# Patient Record
Sex: Female | Born: 1985 | Race: Black or African American | Hispanic: No | Marital: Single | Smoking: Current every day smoker
Health system: Southern US, Community
[De-identification: ages and names within clinical notes are randomized; demographics above are authoritative.]

## PROBLEM LIST (undated history)

## (undated) DIAGNOSIS — F419 Anxiety disorder, unspecified: Secondary | ICD-10-CM

---

## 2014-08-12 HISTORY — PX: TUBAL LIGATION: SHX77

## 2015-03-13 ENCOUNTER — Encounter (HOSPITAL_COMMUNITY): Payer: Self-pay | Admitting: Emergency Medicine

## 2015-03-13 ENCOUNTER — Emergency Department (HOSPITAL_COMMUNITY)
Admission: EM | Admit: 2015-03-13 | Discharge: 2015-03-13 | Disposition: A | Discharge status: Home / Self Care | Payer: Self-pay | Attending: Emergency Medicine | Admitting: Emergency Medicine

## 2015-03-13 DIAGNOSIS — K0889 Other specified disorders of teeth and supporting structures: Secondary | ICD-10-CM

## 2015-03-13 DIAGNOSIS — K088 Other specified disorders of teeth and supporting structures: Secondary | ICD-10-CM | POA: Insufficient documentation

## 2015-03-13 DIAGNOSIS — Z720 Tobacco use: Secondary | ICD-10-CM | POA: Insufficient documentation

## 2015-03-13 DIAGNOSIS — Z880 Allergy status to penicillin: Secondary | ICD-10-CM | POA: Insufficient documentation

## 2015-03-13 MED ORDER — CLINDAMYCIN HCL 150 MG PO CAPS: 150.0000 mg | ORAL_CAPSULE | Freq: Four times a day (QID) | ORAL | Status: DC

## 2015-03-13 MED ORDER — HYDROCODONE-ACETAMINOPHEN 5-325 MG PO TABS: 1.0000 | ORAL_TABLET | Freq: Four times a day (QID) | ORAL | Status: DC | PRN

## 2015-03-13 NOTE — ED Provider Notes (Signed)
CSN: [id number]     Arrival date & time 03/13/15  0102 History   First MD Initiated Contact with Patient 03/13/15 [phone]     Chief Complaint  Patient presents with  . Abscess   HPI   29 year old female presents today with dental pain. Patient reports starting yesterday she experienced right lower dental pain with swelling. She reports a history of the same previously with multiple dental caries. Patient reports she recently moved to the area, and has not made contact with the dentist. She reports pain to palpation, swelling. She denies difficulty swallowing, fever, chills, neck stiffness. Patient reports using ibuprofen and Tylenol with some mild relief of symptoms.    History reviewed. No pertinent past medical history. Past Surgical History  Procedure Laterality Date  . Tubal ligation  2016  . Cesarean section  2016   No family history on file. History  Substance Use Topics  . Smoking status: Current Every Day Smoker -- 0.50 packs/day  . Smokeless tobacco: Not on file  . Alcohol Use: No   OB History    No data available     Review of Systems  All other systems reviewed and are negative.   Allergies  Penicillins  Home Medications   Prior to Admission medications   Medication Sig Start Date End Date Taking? Authorizing Provider  clindamycin (CLEOCIN) 150 MG capsule Take 1 capsule (150 mg total) by mouth every 6 (six) hours. 03/13/15   Eyvonne Mechanic, PA-C  HYDROcodone-acetaminophen (NORCO/VICODIN) 5-325 MG per tablet Take 1 tablet by mouth every 6 (six) hours as needed. 03/13/15   Eyvonne Mechanic, PA-C   BP 111/76 mmHg  Pulse 62  Temp(Src) 98.9 F (37.2 C) (Oral)  Resp 18  Ht  (1.549 m)  Wt 124 lb (56.246 kg)  BMI 23.44 kg/m2  SpO2 100%  LMP 03/02/2015 (Exact Date)    Physical Exam  Constitutional: She is oriented to person, place, and time. She appears well-developed and well-nourished.  HENT:  Head: Normocephalic and atraumatic.  Face asymmetrical with right  sided lower mandibular swelling, no warmth, redness to the cheeks. Tender to palpation. Patient has full range of motion of her jaw. Tongue normal full range of motion, floor mouth soft nontender, posterior oropharynx with no signs of edema or infection.  Obvious swelling to the buccal side of the right second lower molar. No obvious or palpable abscess. Remainder of jaw normal nontender. Patient has numerous other caries throughout.  Eyes: Conjunctivae are normal. Pupils are equal, round, and reactive to light. Right eye exhibits no discharge. Left eye exhibits no discharge. No scleral icterus.  Neck: Normal range of motion. Neck supple. No JVD present. No tracheal deviation present.  Pulmonary/Chest: Effort normal. No stridor.  Neurological: She is alert and oriented to person, place, and time. Coordination normal.  Psychiatric: She has a normal mood and affect. Her behavior is normal. Judgment and thought content normal.  Nursing note and vitals reviewed.   ED Course  Procedures (including critical care time)  NERVE BLOCK Performed by: Thermon Leyland Consent: Verbal consent obtained. Required items: required blood products, implants, devices, and special equipment available Time out: Immediately prior to procedure a "time out" was called to verify the correct patient, procedure, equipment, support staff and site/side marked as required.  Indication: Dental point  Nerve block body site: Inferior alveolar right   Preparation: Patient was prepped and draped in the usual sterile fashion. Needle gauge: 24 G Location technique: anatomical landmarks  Local anesthetic:  Marcaine 0.5% with epinephrine   Anesthetic total: 1.8 ml  Outcome: pain improved Patient tolerance: Patient tolerated the procedure well with no immediate complications.  Risks and benefits procedure   Labs Review Labs Reviewed - No data to display  Imaging Review No results found.   EKG  Interpretation None      MDM   Final diagnoses:  Pain, dental    Labs:  Imaging:  Consults:  Therapeutics: Bupivacaine  Discharge Meds: Clindamycin, Vicodin  Assessment/Plan: Patient presents with dental pain with surrounding soft tissue swelling. This is a localized infection, no palpable abscess that I could I&D at this time. Patient is afebrile, vital signs reassuring, pain significantly improved with dental block. Patient has full active range of motion of neck no concern for deep space infection at this time. Patient be discharged home with oral antibiotics, pain medication, resources for follow-up with local tenderness. He should verbalize understanding and agreement today's plan, understood strict return precautions.         Eyvonne Mechanic, PA-C 03/14/15 1610  Mirian Mo, MD 03/16/15 [phone]

## 2015-03-13 NOTE — ED Notes (Signed)
PA Hedges at bedside  

## 2015-03-13 NOTE — Discharge Instructions (Signed)
Dental Pain °A tooth ache may be caused by cavities (tooth decay). Cavities expose the nerve of the tooth to air and hot or cold temperatures. It may come from an infection or abscess (also called a boil or furuncle) around your tooth. It is also often caused by dental caries (tooth decay). This causes the pain you are having. °DIAGNOSIS  °Your caregiver can diagnose this problem by exam. °TREATMENT  °· If caused by an infection, it may be treated with medications which kill germs (antibiotics) and pain medications as prescribed by your caregiver. Take medications as directed. °· Only take over-the-counter or prescription medicines for pain, discomfort, or fever as directed by your caregiver. °· Whether the tooth ache today is caused by infection or dental disease, you should see your dentist as soon as possible for further care. °SEEK MEDICAL CARE IF: °The exam and treatment you received today has been provided on an emergency basis only. This is not a substitute for complete medical or dental care. If your problem worsens or new problems (symptoms) appear, and you are unable to meet with your dentist, call or return to this location. °SEEK IMMEDIATE MEDICAL CARE IF:  °· You have a fever. °· You develop redness and swelling of your face, jaw, or neck. °· You are unable to open your mouth. °· You have severe pain uncontrolled by pain medicine. °MAKE SURE YOU:  °· Understand these instructions. °· Will watch your condition. °· Will get help right away if you are not doing well or get worse. °Document Released: 07/29/2005 Document Revised: 10/21/2011 Document Reviewed: 03/16/2008 °ExitCare® Patient Information ©2015 [employer]. This information is not intended to replace advice given to you by your health care provider. Make sure you discuss any questions you have with your health care provider. ° °Emergency Department Resource Guide °1) Find a Doctor and Pay Out of Pocket °Although you won't have to find out who  is covered by your insurance plan, it is a good idea to ask around and get recommendations. You will then need to call the office and see if the doctor you have chosen will accept you as a new patient and what types of options they offer for patients who are self-pay. Some doctors offer discounts or will set up payment plans for their patients who do not have insurance, but you will need to ask so you aren't surprised when you get to your appointment. ° °2) Contact Your Local Health Department °Not all health departments have doctors that can see patients for sick visits, but many do, so it is worth a call to see if yours does. If you don't know where your local health department is, you can check in your phone book. The CDC also has a tool to help you locate your state's health department, and many state websites also have listings of all of their local health departments. ° °3) Find a Walk-in Clinic °If your illness is not likely to be very severe or complicated, you may want to try a walk in clinic. These are popping up all over the country in pharmacies, drugstores, and shopping centers. They're usually staffed by nurse practitioners or physician assistants that have been trained to treat common illnesses and complaints. They're usually fairly quick and inexpensive. However, if you have serious medical issues or chronic medical problems, these are probably not your best option. ° °No Primary Care Doctor: °- Call Health Connect at  832-8000 - they can help you locate a primary   care doctor that  accepts your insurance, provides certain services, etc. °- Physician Referral Service- 1-800-533-3463 ° °Chronic Pain Problems: °Organization         Address  Phone   Notes  °[facility]  ([phone]) [phone] Patients need to be referred by their primary care doctor.  ° °Medication Assistance: °Organization         Address  Phone   Notes  °[place] [facility] [address]  [address] °[place], [place] [address] (336) 641-8030 --Must be a resident of [place] °-- Must have NO insurance coverage whatsoever (no Medicaid/ Medicare, etc.) °-- The pt. MUST have a primary care doctor that directs their care regularly and follows them in the community °  °[facility]  (866) 331-1348   °[employer]  (888) 892-1162   ° °Agencies that provide inexpensive medical care: °Organization         Address  Phone   Notes  °[facility]  (336) 832-8035   °[facility]    (336) 832-7272   °[facility] [address] °[place], [place] [address] (336) 832-4777   °[facility] [address], °[place] (336) 271-4999   °[facility]    (336) 373-0678   °[facility]    (336) 272-1050   °[facility] ° [address], [place] Phone:  (336) 832-4444, Fax:  (336) 832-4440 Hours of Operation:  9 am - 6 pm, M-F.  Also accepts Medicaid/Medicare and self-pay.  °[facility] ° [address], [place] Phone: (336) 832-3150, Fax: (336) [phone]. Hours of Operation:  8:30 am - 5:30 pm, M-F.  Also accepts Medicaid and self-pay.  °[facility] [place] [address], [place] Phone: (336) 878-6027   °[facility] [address], [place], [place] (336)723-1848, Ext. 123 Mondays & Thursdays: 7-9 AM.  First 15 patients are seen on a first come, first serve basis. °  ° °Medicaid-accepting [place] Providers: ° °Organization         Address  Phone   Notes  °[facility] [address], [place] (336) 641-2100 Also accepts self-pay patients.  °[facility] [address], [place] ° (336) 856-9996   °[facility] [address], [place] (336) 288-8857   °[facility] [address] [place] [address], [place] (336) 299-7000   °[facility] [address], [place]  ° (336) 373-1557 Only accepts [place] Access Medicaid patients after they have their name applied to their card.  ° °Self-Pay (no insurance) in [place]: ° °Organization         Address  Phone   Notes  °Sickle Cell Patients, [facility] [address], [place] (336) 832-1970   °[facility] [address], [place] (336) 832-4400   ° [facility] ° [address], [place] (336) 992-4800   °[facility]/Dr. Osei-Bonsu ° [address] [place] [address], [place] or [address], [place] (336) [phone] Phone number for both [place] and [place] locations is the same.  °[facility] [address], [place] (336) 299-0000   °[facility] [address] [place] [address], [place] or [address] (336) 852-7530 °(336) 878-2260   °  [facility] [address], [place] (336) 350-1642, phone; (336) 294-5005, fax Sees patients 1st and 3rd Saturday of every month.  Must not qualify for public or private insurance (i.e. Medicaid, Medicare, [place] Health Choice, Veterans' Benefits) • Household income should be no more than 200% of the poverty level •The clinic cannot treat you if you are pregnant or think you are pregnant • Sexually transmitted diseases are not treated at the clinic.  ° ° °Dental Care: °Organization         Address  Phone  Notes  °[place] [facility] [address], [place] (336) 641-6152 Accepts children up to age 21 who are enrolled in Medicaid or [place] Health Choice; pregnant women with a Medicaid card; and children who have applied for Medicaid or [place] Health Choice, but were declined, whose parents can pay a reduced fee at time of service.  °[facility] [place]  [address], [place] (336) 641-7733 Accepts children up to age 21 who are  enrolled in Medicaid or [place] Health Choice; pregnant women with a Medicaid card; and children who have applied for Medicaid or [place] Health Choice, but were declined, whose parents can pay a reduced fee at time of service.  °[facility] ° [address], [place] (336) 641-4533 Patients are seen by appointment only. Walk-ins are not accepted. [facility] will see patients 18 years of age and older. °Monday - Tuesday (8am-5pm) °Most Wednesdays (8:30-5pm) °$30 per visit, cash only  °[facility] ° [address], [place] (336) [phone] Patients are seen by appointment only. Walk-ins are not accepted. [facility] will see patients 18 years of age and older. °One Wednesday Evening (Monthly: Volunteer Based).  $30 per visit, cash only  °[facility]  (919) 537-3737 for adults; Children under age 4, call Graduate Pediatric Dentistry at ([phone]) [phone]. Children aged 4-14, please call ([phone]) [phone] to request a pediatric application. ° Dental services are provided in all areas of dental care including fillings, crowns and bridges, complete and partial dentures, implants, gum treatment, root canals, and extractions. Preventive care is also provided. Treatment is provided to both adults and children. °Patients are selected via a lottery and there is often a waiting list. °  °[facility] [address], ° ° (336) 763-8833 [email or web address] °  °[facility] [address], [place], [place] (336)723-1848, Ext. 123 Second and Fourth Thursday of each month, opens at 6:30 AM; Clinic ends at 9 AM.  Patients are seen on a first-come first-served basis, and a limited number are seen during each clinic.  ° °[facility] ° [address], [place], [place] (336) [phone]   Eligibility Requirements °You must have lived in [place], [place], or [place] counties for at least the last three months. °  You  cannot be eligible for state or federal sponsored healthcare insurance, including Veterans Administration, Medicaid, or Medicare. °  You generally cannot be eligible for healthcare insurance through your employer.  °  How to apply: °Eligibility screenings are held every Tuesday and Wednesday afternoon from 1:00 pm until 4:00 pm. You do not need an appointment for the interview!  °[facility] [address], [place],  [phone]   °[facility]  336-342-8273   °[facility]  336-703-3100   °[facility]   [place] in the Community: Intensive Outpatient Programs Organization         Address  Phone  Notes  [facility] [place] [address] [place], [place] (934) 758-5776   [facility] [address], [place], [place]   [facility] [address] [place], [place]   [place] [address]  [place], [place] or [phone]   Substance Abuse Resources Organization         Address  Phone  Notes  [facility]  724-828-4836   [place]  250-131-6260   [facility]   Chinita Pester  586 544 4668   Residential & Outpatient Substance Abuse Program  (801)402-8013   Psychological Services Organization         Address  Phone  Notes  [facility] [place]  [place]  (941)746-9411   [place] [address] [place] or 339-836-4699    Mobile Crisis Teams Organization         Address  Phone  Notes  [facility], Mobile Crisis Care Unit  626-147-2728   [facility]  [address] [place], [place]   [place] [address] [place] (412) 818-8695    Self-Help/Support  Groups Organization         Address  Phone             Notes  [facility] [place] - variety of support groups  [place] Call for more information  Narcotics Anonymous (NA), [facility] [address], [employer] [place]  2 meetings at this location   Special educational needs teacher         Address  Phone  Notes  [facility] [place],    [place]  1-628-731-6347   [facility]  [address] [place] [address], [place], [place]   [place], [place] (704)[phone] Admissions: 8am-3pm M-F  Incent[facility] Substance [place] [address]    [place], [place] [phone]   [facility] [address] [place], [place], [place]   [facility] [address]  [place], [place]   [facility] - Intensive Outpatient [address], [place], [place]   [facility] (House.) [place]  [place], [place] 1-956-035-2015 or (929)697-8638   [facility] ([facility]) [address], [place], [place] Accepts Medicaid  [facility] [address],  [place] 1-(209)636-8495 Substance Abuse/Addiction Treatment   [facility]         Address  Phone  Notes  [facility]  641-236-8678   Domenic Schwab, PhD [address] [place], [place]   (475)585-4080 or (412) [phone]   [place], [place] [phone]   [place] [address] [place], [place] [phone] Insurance/Medicaid/sponsorship through [employer] and Families [address], [place]                                    [place], [place] [phone] Therapy/tele-psych/case  [facility] [address]   [place], [place] (864)[phone]    Dr. Lolly Mustache  [phone]   [facility]  [facility] Dept. 1) [address] [place] 2) [address], [place] 3)  [address], [place] [phone]  (787)[phone]   [facility] Child Abuse Hotline (281)356-3143 or 6084830675 (After Hours)     Please use medication only as directed, please immediately follow-up with dentist for further evaluation and management. Please return immediately if new or worsening signs or symptoms present

## 2015-03-13 NOTE — ED Notes (Signed)
Patient reports lower right tooth pain x 2 days. Reports that she has had abscesses previously, and believes this to be as well. Rates pain 10/10 on 0-10 pain scale.

## 2015-03-29 ENCOUNTER — Emergency Department (HOSPITAL_COMMUNITY)
Admission: EM | Admit: 2015-03-29 | Discharge: 2015-03-29 | Disposition: A | Discharge status: Home / Self Care | Payer: Self-pay | Attending: Emergency Medicine | Admitting: Emergency Medicine

## 2015-03-29 ENCOUNTER — Encounter (HOSPITAL_COMMUNITY): Payer: Self-pay | Admitting: Emergency Medicine

## 2015-03-29 DIAGNOSIS — L0291 Cutaneous abscess, unspecified: Secondary | ICD-10-CM

## 2015-03-29 DIAGNOSIS — Z880 Allergy status to penicillin: Secondary | ICD-10-CM | POA: Insufficient documentation

## 2015-03-29 DIAGNOSIS — Z720 Tobacco use: Secondary | ICD-10-CM | POA: Insufficient documentation

## 2015-03-29 DIAGNOSIS — M65052 Abscess of tendon sheath, left thigh: Secondary | ICD-10-CM | POA: Insufficient documentation

## 2015-03-29 HISTORY — DX: Anxiety disorder, unspecified: F41.9

## 2015-03-29 MED ORDER — SULFAMETHOXAZOLE-TRIMETHOPRIM 800-160 MG PO TABS: 1.0000 | ORAL_TABLET | Freq: Two times a day (BID) | ORAL | Status: AC

## 2015-03-29 MED ORDER — LIDOCAINE-EPINEPHRINE (PF) 2 %-1:200000 IJ SOLN
10.0000 mL | Freq: Once | INTRAMUSCULAR | Status: AC
Start: 2015-03-29 — End: 2015-03-29
  Administered 2015-03-29: 10 mL
  Filled 2015-03-29: qty 20

## 2015-03-29 NOTE — ED Provider Notes (Signed)
History  This chart was scribed for non-physician practitioner, Mayme Genta, PA-C,working with Lavera Guise, MD, by Karle Plumber, ED Scribe. This patient was seen in room TR05C/TR05C and the patient's care was started at 10:00 PM.  Chief Complaint  Patient presents with  . Abscess   The history is provided by the patient and medical records. No language interpreter was used.    HPI Comments:  Alexis Ferrell is a 29 y.o. female who presents to the Emergency Department complaining of an abscess to the left inner thigh that appeared approximately one week ago. She reports moderate pain of the area and rates it as 6-7/10. She has not taken anything for pain. She states she has been taking hot showers with no resolution of the abscess. Touching the area makes the pain worse. She denies alleviating factors. She denies fever, chills, nausea, vomiting, bowel or bladder changes. PMHx of HTN.  Past Medical History  Diagnosis Date  . Anxiety    Past Surgical History  Procedure Laterality Date  . Tubal ligation  2016  . Cesarean section  2016   No family history on file. Social History  Substance Use Topics  . Smoking status: Current Every Day Smoker -- 0.50 packs/day  . Smokeless tobacco: None  . Alcohol Use: No   OB History    No data available     Review of Systems  Constitutional: Negative for fever and chills.  Gastrointestinal: Negative for nausea and vomiting.  Genitourinary:       No bowel or bladder changes  Skin: Positive for wound.  All other systems reviewed and are negative.   Allergies  Penicillins  Home Medications   Prior to Admission medications   Medication Sig Start Date End Date Taking? Authorizing Provider  clindamycin (CLEOCIN) 150 MG capsule Take 1 capsule (150 mg total) by mouth every 6 (six) hours. 03/13/15   Eyvonne Mechanic, PA-C  HYDROcodone-acetaminophen (NORCO/VICODIN) 5-325 MG per tablet Take 1 tablet by mouth every 6 (six) hours as needed.  03/13/15   Eyvonne Mechanic, PA-C  sulfamethoxazole-trimethoprim (BACTRIM DS,SEPTRA DS) 800-160 MG per tablet Take 1 tablet by mouth 2 (two) times daily. 03/29/15 04/05/15  Joycie Peek, PA-C   Triage Vitals: BP 143/106 mmHg  Pulse 95  Temp(Src) 98.5 F (36.9 C) (Oral)  Resp 20  SpO2 98%  LMP 03/02/2015 (Exact Date) Physical Exam  Constitutional: She is oriented to person, place, and time. She appears well-developed and well-nourished.  HENT:  Head: Normocephalic and atraumatic.  Eyes: EOM are normal.  Neck: Normal range of motion.  Cardiovascular: Normal rate.   Pulmonary/Chest: Effort normal.  Musculoskeletal: Normal range of motion.  Neurological: She is alert and oriented to person, place, and time.  Skin: Skin is warm and dry. There is erythema.  2 raised erythematous lesions to medial aspect of left proximal thigh consistent with abscess. No active drainage.  Psychiatric: She has a normal mood and affect. Her behavior is normal.  Nursing note and vitals reviewed.   ED Course  Procedures (including critical care time) DIAGNOSTIC STUDIES: Oxygen Saturation is 98% on RA, normal by my interpretation.   COORDINATION OF CARE: 10:07 PM- Bedside ultrasound performed. Will incise and drain abscesses. Pt verbalizes understanding and agrees to plan.  EMERGENCY DEPARTMENT US SOFT TISSUE INTERPRETATION "Study: Limited Ultrasound of the noted body part in comments below"  INDICATIONS: Soft tissue infection Multiple views of the body part are obtained with a multi-frequency linear probe  PERFORMED BY:  Myself  IMAGES ARCHIVED?: Yes  SIDE:Left  BODY PART:Lower extremity  FINDINGS: Abcess present  LIMITATIONS:  Body Habitus  INTERPRETATION:  Cellulitis present  COMMENT:  Abscess present with small surrounding cellulitis    INCISION AND DRAINAGE PROCEDURE NOTE: Patient identification was confirmed and verbal consent was obtained. This procedure was performed by Mayme Genta, PA-C at 10:09 PM. Site: left inner thigh Sterile procedures observed Needle size: 25 G Anesthetic used (type and amt): Lidocaine 2% with Epinephrine (3 mLs) Blade size: 11 Drainage: scant Complexity: Complex Packing used: none Site anesthetized, incision made over site, wound drained and explored loculations, rinsed with copious amounts of normal saline, covered with dry, sterile dressing. Pt tolerated procedure well without complications. Instructions for care discussed verbally and pt provided with additional written instructions for homecare and f/u.   Medications  lidocaine-EPINEPHrine (XYLOCAINE W/EPI) 2 %-1:200000 (PF) injection 10 mL (10 mLs Infiltration Given 03/29/15 2214)    Labs Review Labs Reviewed - No data to display  Imaging Review No results found. I have personally reviewed and evaluated these images and lab results as part of my medical decision-making.   EKG Interpretation None     Meds given in ED:  Medications  lidocaine-EPINEPHrine (XYLOCAINE W/EPI) 2 %-1:200000 (PF) injection 10 mL (10 mLs Infiltration Given 03/29/15 2214)    Discharge Medication List as of 03/29/2015 10:52 PM    START taking these medications   Details  sulfamethoxazole-trimethoprim (BACTRIM DS,SEPTRA DS) 800-160 MG per tablet Take 1 tablet by mouth 2 (two) times daily., Starting 03/29/2015, Until Wed 04/05/15, Print       Filed Vitals:   03/29/15 2138 03/29/15 2257  BP: 143/106 115/80  Pulse: 95 72  Temp: 98.5 F (36.9 C)   TempSrc: Oral   Resp: 20 18  SpO2: 98% 100%    MDM  Vitals stable - WNL -afebrile Pt resting comfortably in ED. PE--small abscess noted to left medial proximal thigh. Scant drainage with IND. Due to mild surrounding cellulitis and location of abscess, Will DC with antibiotics. Also given referral to general surgery for definitive care follow-up as this is a recurring issue.  I discussed all relevant lab findings and imaging results with pt  and they verbalized understanding. Discussed f/u with PCP within 48 hrs and return precautions, pt very amenable to plan.  Final diagnoses:  Abscess      I personally performed the services described in this documentation, which was scribed in my presence. The recorded information has been reviewed and is accurate.     Joycie Peek, PA-C 03/30/15 0147  Lavera Guise, MD 03/30/15 [phone]

## 2015-03-29 NOTE — ED Notes (Signed)
Pt left at this time with all belongings.  

## 2015-03-29 NOTE — ED Notes (Signed)
C/o abscess to L inner thigh x 1 week.  History of same.

## 2015-03-29 NOTE — Discharge Instructions (Signed)
Please follow-up with Gen. surgery for further evaluation and management of your abscess. Please take her antibiotics as prescribed. Return to ED for worsening symptoms.  Abscess Care After An abscess (also called a boil or furuncle) is an infected area that contains a collection of pus. Signs and symptoms of an abscess include pain, tenderness, redness, or hardness, or you may feel a moveable soft area under your skin. An abscess can occur anywhere in the body. The infection may spread to surrounding tissues causing cellulitis. A cut (incision) by the surgeon was made over your abscess and the pus was drained out. Gauze may have been packed into the space to provide a drain that will allow the cavity to heal from the inside outwards. The boil may be painful for 5 to 7 days. Most people with a boil do not have high fevers. Your abscess, if seen early, may not have localized, and may not have been lanced. If not, another appointment may be required for this if it does not get better on its own or with medications. HOME CARE INSTRUCTIONS   Only take over-the-counter or prescription medicines for pain, discomfort, or fever as directed by your caregiver.  When you bathe, soak and then remove gauze or iodoform packs at least daily or as directed by your caregiver. You may then wash the wound gently with mild soapy water. Repack with gauze or do as your caregiver directs. SEEK IMMEDIATE MEDICAL CARE IF:   You develop increased pain, swelling, redness, drainage, or bleeding in the wound site.  You develop signs of generalized infection including muscle aches, chills, fever, or a general ill feeling.  An oral temperature above 102 F (38.9 C) develops, not controlled by medication. See your caregiver for a recheck if you develop any of the symptoms described above. If medications (antibiotics) were prescribed, take them as directed. Document Released: 02/14/2005 Document Revised: 10/21/2011 Document  Reviewed: 10/12/2007 [facility] Patient Information 2015 [place], [place]. This information is not intended to replace advice given to you by your health care provider. Make sure you discuss any questions you have with your health care provider.

## 2020-07-28 ENCOUNTER — Encounter: Payer: Self-pay | Admitting: Emergency Medicine

## 2020-07-28 ENCOUNTER — Emergency Department
Admission: EM | Admit: 2020-07-28 | Discharge: 2020-07-28 | Disposition: A | Discharge status: Home / Self Care | Payer: Medicaid - Out of State | Attending: Emergency Medicine | Admitting: Emergency Medicine

## 2020-07-28 ENCOUNTER — Other Ambulatory Visit: Payer: Self-pay

## 2020-07-28 DIAGNOSIS — F17200 Nicotine dependence, unspecified, uncomplicated: Secondary | ICD-10-CM | POA: Insufficient documentation

## 2020-07-28 DIAGNOSIS — L0291 Cutaneous abscess, unspecified: Secondary | ICD-10-CM

## 2020-07-28 DIAGNOSIS — L02413 Cutaneous abscess of right upper limb: Secondary | ICD-10-CM | POA: Diagnosis not present

## 2020-07-28 MED ORDER — SULFAMETHOXAZOLE-TRIMETHOPRIM 800-160 MG PO TABS: 1.0000 | ORAL_TABLET | Freq: Two times a day (BID) | ORAL | 0 refills | Status: DC

## 2020-07-28 NOTE — ED Notes (Signed)
Dry sterile dressing applied to right groin abscess.

## 2020-07-28 NOTE — Discharge Instructions (Signed)
Follow-up with Dr. Everlene Farrier please call for an appointment.  Continue to apply warm compress.  Take the antibiotic as prescribed.  Keep a bandage over your leg.  Return emergency department for worsening

## 2020-07-28 NOTE — ED Provider Notes (Signed)
[facility] Emergency Department Provider Note  ____________________________________________   Event Date/Time   First MD Initiated Contact with Patient 07/28/20 1216     (approximate)  I have reviewed the triage vital signs and the nursing notes.   HISTORY  Chief Complaint Abscess    HPI Alexis Ferrell is a 34 y.o. female presents emergency department complaining of abscess to the right upper leg which is recurrent.  Patient states the area burst earlier today.  She states all the pus came out.  States it hurts when she walks as her legs rub together.  She wants me to take the core out so will happen again.  She denies fever chills   Past Medical History:  Diagnosis Date  . Anxiety     There are no problems to display for this patient.   Past Surgical History:  Procedure Laterality Date  . CESAREAN SECTION  2016  . TUBAL LIGATION  2016    Prior to Admission medications   Medication Sig Start Date End Date Taking? Authorizing Provider  sulfamethoxazole-trimethoprim (BACTRIM DS) 800-160 MG tablet Take 1 tablet by mouth 2 (two) times daily. 07/28/20   Faythe Ghee, PA-C    Allergies Penicillins  History reviewed. No pertinent family history.  Social History Social History   Tobacco Use  . Smoking status: Current Every Day Smoker    Packs/day: 0.50  Substance Use Topics  . Alcohol use: No  . Drug use: No    Review of Systems  Constitutional: No fever/chills Eyes: No visual changes. ENT: No sore throat. Respiratory: Denies cough Genitourinary: Negative for dysuria. Musculoskeletal: Negative for back pain. Skin: Negative for rash. Psychiatric: no mood changes,     ____________________________________________   PHYSICAL EXAM:  VITAL SIGNS: ED Triage Vitals [07/28/20 1125]  Enc Vitals Group     BP 116/86     Pulse Rate 72     Resp 18     Temp 97.9 F (36.6 C)     Temp Source Oral     SpO2 99 %     Weight 135  lb (61.2 kg)     Height 5\' 1"  (1.549 m)     Head Circumference      Peak Flow      Pain Score 8     Pain Loc      Pain Edu?      Excl. in GC?     Constitutional: Alert and oriented. Well appearing and in no acute distress. Eyes: Conjunctivae are normal.  Head: Atraumatic. Nose: No congestion/rhinnorhea. Mouth/Throat: Mucous membranes are moist.   Neck:  supple no lymphadenopathy noted Cardiovascular: Normal rate, regular rhythm.  Respiratory: Normal respiratory effort.  No retractions,  GU: deferred Musculoskeletal: FROM all extremities, warm and well perfused Neurologic:  Normal speech and language.  Skin:  Skin is warm, dry, positive for a 1 cm sized open area on the right upper leg, some induration noted around the outside but no fluctuance, area appears to have already drained  psychiatric: Mood and affect are normal. Speech and behavior are normal.  ____________________________________________   LABS (all labs ordered are listed, but only abnormal results are displayed)  Labs Reviewed - No data to display ____________________________________________   ____________________________________________  RADIOLOGY    ____________________________________________   PROCEDURES  Procedure(s) performed: No  Procedures    ____________________________________________   INITIAL IMPRESSION / ASSESSMENT AND PLAN / ED COURSE  Pertinent labs & imaging results that were available during my  care of the patient were reviewed by me and considered in my medical decision making (see chart for details).   Patient is 34 year old female presents with an abscess to the right upper leg.  See HPI.  Physical exam shows a already opened and draining abscess.  I did explain findings to the patient.  Explained her it is nothing we can do from the ED other than place her on antibiotics.  She is to follow-up with surgery.  Patient is not happy with my answer but unfortunately this is  standard of care for this type of abscess.  She was given a prescription for Septra and a work note.  She is to follow-up with surgery and a phone number was provided.  She is discharged stable condition.     Alexis Ferrell was evaluated in Emergency Department on 07/28/2020 for the symptoms described in the history of present illness. She was evaluated in the context of the global COVID-19 pandemic, which necessitated consideration that the patient might be at risk for infection with the SARS-CoV-2 virus that causes COVID-19. Institutional protocols and algorithms that pertain to the evaluation of patients at risk for COVID-19 are in a state of rapid change based on information released by regulatory bodies including the CDC and federal and state organizations. These policies and algorithms were followed during the patient's care in the ED.    As part of my medical decision making, I reviewed the following data within the electronic MEDICAL RECORD NUMBER Nursing notes reviewed and incorporated, Old chart reviewed, Notes from prior ED visits and [place] Controlled Substance Database  ____________________________________________   FINAL CLINICAL IMPRESSION(S) / ED DIAGNOSES  Final diagnoses:  Abscess      NEW MEDICATIONS STARTED DURING THIS VISIT:  New Prescriptions   SULFAMETHOXAZOLE-TRIMETHOPRIM (BACTRIM DS) 800-160 MG TABLET    Take 1 tablet by mouth 2 (two) times daily.     Note:  This document was prepared using Dragon voice recognition software and may include unintentional dictation errors.    Faythe Ghee, PA-C 07/28/20 1228    Jene Every, MD 07/28/20 [phone]

## 2020-07-28 NOTE — ED Triage Notes (Signed)
Pt to ED via POV with abscess that has opened earlier today, pt reports earlier today was bleeding. No bleeding noted at this time.

## 2020-08-02 ENCOUNTER — Encounter: Payer: Self-pay | Admitting: Surgery

## 2020-08-02 ENCOUNTER — Ambulatory Visit (INDEPENDENT_AMBULATORY_CARE_PROVIDER_SITE_OTHER): Payer: PRIVATE HEALTH INSURANCE | Admitting: Surgery

## 2020-08-02 ENCOUNTER — Other Ambulatory Visit: Payer: Self-pay

## 2020-08-02 VITALS — BP 118/80 | HR 86 | Temp 97.7°F | Ht 61.0 in | Wt 143.8 lb

## 2020-08-02 DIAGNOSIS — L732 Hidradenitis suppurativa: Secondary | ICD-10-CM | POA: Diagnosis not present

## 2020-08-02 MED ORDER — SULFAMETHOXAZOLE-TRIMETHOPRIM 800-160 MG PO TABS: 1.0000 | ORAL_TABLET | Freq: Two times a day (BID) | ORAL | 0 refills | Status: AC

## 2020-08-02 NOTE — Patient Instructions (Signed)
Our surgery scheduler Britta Mccreedy will be calling you within 24-48 to get you scheduled for surgery. If you have not heard anything by Wednesday 12/29, please give our office a call. Please have your blue sheet ready to write down important information when she calls.  Take the antibiotic Batrim for the next 10 days. Pick up from pharmacy.

## 2020-08-04 NOTE — Progress Notes (Signed)
Patient ID: Alexis Ferrell, female   DOB: March 27, 1986, 34 y.o.   MRN: [id number]  HPI Alexis Ferrell is a 34 y.o. female seen in consultation at the request of Ms. Fisher PA-C for potential right thigh abscess.  She went to emergency room about a week ago complaining of right thigh pain.  She still have intermittent pain that is in the right thigh that is sharp moderate intensity worsening when she walks.  No fevers no chills.  She does have a history of hidradenitis in the past.  The lesion is very close to the right inguinal area.  He was placed on Bactrim with significant improvement in symptoms but now she has some pain.  HPI  Past Medical History:  Diagnosis Date  . Anxiety     Past Surgical History:  Procedure Laterality Date  . CESAREAN SECTION  2016  . TUBAL LIGATION  2016    History reviewed. No pertinent family history.  Social History Social History   Tobacco Use  . Smoking status: Current Every Day Smoker    Packs/day: 0.50  . Smokeless tobacco: Current User  Substance Use Topics  . Alcohol use: No  . Drug use: No    Allergies  Allergen Reactions  . Penicillins Swelling    Current Outpatient Medications  Medication Sig Dispense Refill  . sulfamethoxazole-trimethoprim (BACTRIM DS) 800-160 MG tablet Take 1 tablet by mouth 2 (two) times daily. 20 tablet 0   No current facility-administered medications for this visit.     Review of Systems Full ROS  was asked and was negative except for the information on the HPI  Physical Exam Blood pressure 118/80, pulse 86, temperature 97.7 F (36.5 C), temperature source Oral, height 5\' 1"  (1.549 m), weight 143 lb 12.8 oz (65.2 kg), SpO2 98 %. CONSTITUTIONAL: NAD EYES: Pupils are equal, round,  Sclera are non-icteric. EARS, NOSE, MOUTH AND THROAT: She is wearing a mask. Hearing is intact to voice. LYMPH NODES:  Lymph nodes in the neck are normal. RESPIRATORY:  Lungs are clear. There is normal respiratory effort, with  equal breath sounds bilaterally, and without pathologic use of accessory muscles. CARDIOVASCULAR: Heart is regular without murmurs, gallops, or rubs. GI: The abdomen is soft, nontender, and nondistended. There are no palpable masses. There is no hepatosplenomegaly. There are normal bowel sounds in all quadrants. GU: Rectal deferred.   MUSCULOSKELETAL: Normal muscle strength and tone. No cyanosis or edema.   SKIN: There is evidence of a 2 cm fluctuant lesion on the right thigh close to the inguinal area.  There is no evidence of necrotizing infection.  NEUROLOGIC: Motor and sensation is grossly normal. Cranial nerves are grossly intact. PSYCH:  Oriented to person, place and time. Affect is normal.  Data Reviewed  I have personally reviewed the patient's imaging, laboratory findings and medical records.    Assessment/Plan Right thigh hidradenitis with potential abscess.  Discussed with patient in detail about her disease process option of potential I&D here in the office versus in the operating room were explained to the patient.  Patient does have significant sensitivities to the right thigh.   She chooses to proceed with I&D and excision of hidradenitis in the OR.  In the interim we will start antibiotic therapy.  She understands that she may need more urgent I&D.  Discussed with patient detail.  Risks, benefits and possible complications including but not limited to: Bleeding, infection, chronic pain.  She understands and wished to proceed Copy of this report was  sent to the referring provider Sterling Big, MD FACS General Surgeon 08/04/2020, 5:05 PM

## 2020-08-09 ENCOUNTER — Telehealth: Payer: Self-pay | Admitting: Surgery

## 2020-08-09 NOTE — Telephone Encounter (Signed)
After getting surgery scheduled, patient then calls back to cancel her surgery for 08/20/20.  Patient has come into the area just recently, not sure if her to stay.  But she has [place] Medicaid.  She is informed that we do not accept out of state medicaid, she would be a self pay.  After she called the number for patient accounts, they had informed her the amount that she would owe.  Patient states does not have the money for surgery.  At her request all is cancelled.

## 2020-08-15 ENCOUNTER — Other Ambulatory Visit: Payer: PRIVATE HEALTH INSURANCE

## 2020-08-16 ENCOUNTER — Other Ambulatory Visit: Payer: PRIVATE HEALTH INSURANCE

## 2020-08-17 ENCOUNTER — Encounter: Admission: RE | Payer: Self-pay | Source: Home / Self Care

## 2020-08-17 ENCOUNTER — Ambulatory Visit: Admission: RE | Admit: 2020-08-17 | Payer: PRIVATE HEALTH INSURANCE | Source: Home / Self Care | Admitting: Surgery

## 2020-08-17 SURGERY — EXCISION, HIDRADENITIS, INGUINAL REGION
Anesthesia: Choice | Laterality: Right

## 2020-09-14 ENCOUNTER — Other Ambulatory Visit: Payer: Self-pay

## 2020-09-14 ENCOUNTER — Encounter: Payer: Self-pay | Admitting: Emergency Medicine

## 2020-09-14 ENCOUNTER — Emergency Department: Payer: Medicaid - Out of State

## 2020-09-14 ENCOUNTER — Emergency Department
Admission: EM | Admit: 2020-09-14 | Discharge: 2020-09-14 | Disposition: A | Discharge status: Home / Self Care | Payer: Medicaid - Out of State | Attending: Emergency Medicine | Admitting: Emergency Medicine

## 2020-09-14 DIAGNOSIS — F17200 Nicotine dependence, unspecified, uncomplicated: Secondary | ICD-10-CM | POA: Insufficient documentation

## 2020-09-14 DIAGNOSIS — M79674 Pain in right toe(s): Secondary | ICD-10-CM | POA: Insufficient documentation

## 2020-09-14 MED ORDER — IBUPROFEN 600 MG PO TABS
600.0000 mg | ORAL_TABLET | Freq: Once | ORAL | Status: AC
  Administered 2020-09-14: 600 mg via ORAL
  Filled 2020-09-14: qty 1

## 2020-09-14 NOTE — ED Provider Notes (Signed)
[facility] Emergency Department Provider Note  ____________________________________________   Event Date/Time   First MD Initiated Contact with Patient 09/14/20 [phone]     (approximate)  I have reviewed the triage vital signs and the nursing notes.   HISTORY  Chief Complaint Toe Pain    HPI Alexis Ferrell is a 35 y.o. female with no chronic medical problems except for history of asthma.  She presents by EMS from work for evaluation of right great toe pain.  She said the toe has been hurting for about a month and sometimes feels numb but mostly is an aching pain that has become severe.  It is worse when she is on her feet for long periods of time.  She has not had any specific trauma or injury.  She has to wear steel toed sneakers for work  that she feels are not fitted well for her.  She has not seen a foot specialist in the past.  She has no other pain or injuries.  Nothing in particular makes her feel better.        Past Medical History:  Diagnosis Date  . Anxiety     There are no problems to display for this patient.   Past Surgical History:  Procedure Laterality Date  . CESAREAN SECTION  2016  . TUBAL LIGATION  2016    Prior to Admission medications   Medication Sig Start Date End Date Taking? Authorizing Provider  sulfamethoxazole-trimethoprim (BACTRIM DS) 800-160 MG tablet Take 1 tablet by mouth 2 (two) times daily. 08/02/20   Pabon, Merri Ray, MD    Allergies Banana, Watermelon flavor, and Penicillins  History reviewed. No pertinent family history.  Social History Social History   Tobacco Use  . Smoking status: Current Every Day Smoker    Packs/day: 0.50  . Smokeless tobacco: Current User  Substance Use Topics  . Alcohol use: No  . Drug use: No    Review of Systems Constitutional: No fever/chills Respiratory: Denies shortness of breath. Gastrointestinal: No abdominal pain.  No nausea, no vomiting.   Musculoskeletal: Right  great toe pain, worse recently. Integumentary: Negative for rash.  No wound. Neurological: Some numbness in the right toe associated with the pain.   ____________________________________________   PHYSICAL EXAM:  VITAL SIGNS: ED Triage Vitals  Enc Vitals Group     BP 09/14/20 0338 (!) 130/99     Pulse Rate 09/14/20 0338 83     Resp 09/14/20 0338 14     Temp 09/14/20 0338 98.1 F (36.7 C)     Temp Source 09/14/20 0338 Oral     SpO2 09/14/20 0338 100 %     Weight 09/14/20 0339 59 kg (130 lb)     Height 09/14/20 0339 1.549 m (5\' 1" )     Head Circumference --      Peak Flow --      Pain Score 09/14/20 0338 9     Pain Loc --      Pain Edu? --      Excl. in GC? --     Constitutional: Alert and oriented.  Eyes: Conjunctivae are normal.  Head: Atraumatic. Cardiovascular: Normal rate, regular rhythm. Good peripheral circulation. Respiratory: Normal respiratory effort.  No retractions. Musculoskeletal: Normal-appearing right foot and particularly the right great toe.  No evidence of podagra, cellulitis, compartment syndrome, gross deformity that would suggest fracture or dislocation, wound, subungual hematoma.  The patient reports pain when I palpate the toe but it is soft  and the compartments are easily compressible.  There is no induration or erythema. Neurologic:  Normal speech and language. No gross focal neurologic deficits are appreciated.  Skin:  Skin is warm, dry and intact.  No evidence of lacerations, ecchymosis, contusions, nor diabetic wounds on the patient's right foot and toe. Psychiatric: Mood and affect are normal. Speech and behavior are normal.  ____________________________________________   LABS (all labs ordered are listed, but only abnormal results are displayed)  Labs Reviewed - No data to display ____________________________________________  EKG  No indication for emergent EKG ____________________________________________  RADIOLOGY I, Loleta Rose,  personally viewed and evaluated these images (plain radiographs) as part of my medical decision making, as well as reviewing the written report by the radiologist.  ED MD interpretation: No acute abnormalities  Official radiology report(s): DG Toe Great Right  Result Date: 09/14/2020 CLINICAL DATA:  First toe pain for 1 month, no known injury, initial encounter EXAM: RIGHT GREAT TOE COMPARISON:  None. FINDINGS: There is no evidence of fracture or dislocation. There is no evidence of arthropathy or other focal bone abnormality. Soft tissues are unremarkable. IMPRESSION: No acute abnormality noted. Electronically Signed   By: Alcide Clever M.D.   On: 09/14/2020 03:54    ____________________________________________   PROCEDURES   Procedure(s) performed (including Critical Care):  Procedures   ____________________________________________   INITIAL IMPRESSION / MDM / ASSESSMENT AND PLAN / ED COURSE  As part of my medical decision making, I reviewed the following data within the electronic MEDICAL RECORD NUMBER Nursing notes reviewed and incorporated, Old chart reviewed, Radiograph reviewed , Notes from prior ED visits and [place] Controlled Substance Database   Differential diagnosis includes, but is not limited to, musculoskeletal pain/strain, stress fracture, gout/podagra, dislocation, diabetic foot wound, cellulitis, neuropathy, plantar fasciitis.  Strongly suspect chronic pain due to her footwear and job responsibilities.  I ordered a radiograph to see if there is any evidence of bony abnormality that might suggest a healing stress fracture.  There is no evidence of infection, edema, gout, etc.  I explained it was unlikely I would identify a specific cause but I will encourage her to follow-up with podiatry and provide some follow-up information.  No indication for blood work.  Patient understands and agrees with the plan.       Clinical Course as of 09/14/20 0444  Thu Sep 14, 2020  0441 DG  Toe Great Right I personally reviewed the patient's imaging and agree with the radiologist's interpretation that there is no evidence of an acute abnormality.  I updated the patient with the reassuring results.  I will provide her with a hard soled shoe for comfort and strongly encourage close podiatry follow-up.  She understands and agrees with the plan. [CF]    Clinical Course User Index [CF] Loleta Rose, MD     ____________________________________________  FINAL CLINICAL IMPRESSION(S) / ED DIAGNOSES  Final diagnoses:  Great toe pain, right     MEDICATIONS GIVEN DURING THIS VISIT:  Medications  ibuprofen (ADVIL) tablet 600 mg (has no administration in time range)     ED Discharge Orders    None      *Please note:  Lakrista Scaduto was evaluated in Emergency Department on 09/14/2020 for the symptoms described in the history of present illness. She was evaluated in the context of the global COVID-19 pandemic, which necessitated consideration that the patient might be at risk for infection with the SARS-CoV-2 virus that causes COVID-19. Institutional protocols and algorithms that pertain to  the evaluation of patients at risk for COVID-19 are in a state of rapid change based on information released by regulatory bodies including the CDC and federal and state organizations. These policies and algorithms were followed during the patient's care in the ED.  Some ED evaluations and interventions may be delayed as a result of limited staffing during and after the pandemic.*  Note:  This document was prepared using Dragon voice recognition software and may include unintentional dictation errors.   Loleta Rose, MD 09/14/20 [phone]

## 2020-09-14 NOTE — Discharge Instructions (Addendum)
We did not identify a specific reason that your right big toe has been hurting for months and is worse recently.  It likely has to do with your shoes and prolonged periods of time was standing on your feet.  Try using the provided hard soled shoe which can provide some comfort . we recommend you take doses of ibuprofen and Tylenol, use ice packs on your toe when possible, elevate it when you are not having to be standing, and follow-up with the podiatrist at the next available opportunity.  He may be able to offer recommendations in terms of inserts or different kinds of shoes that would satisfy your job requirements but me more comfortable and not because your toe to hurt.

## 2020-09-14 NOTE — ED Triage Notes (Signed)
Pt to ED via EMS from work c/o right big toe pain x1 month.  Had trouble standing any longer at work and called EMS.  No known injury, no obvious swelling or deformity.  Skin WNL, + pedal  Pulses.

## 2021-04-12 ENCOUNTER — Emergency Department (HOSPITAL_COMMUNITY)
Admission: EM | Admit: 2021-04-12 | Discharge: 2021-04-12 | Disposition: A | Discharge status: Home / Self Care | Payer: Medicaid - Out of State | Attending: Emergency Medicine | Admitting: Emergency Medicine

## 2021-04-12 ENCOUNTER — Emergency Department (HOSPITAL_COMMUNITY): Payer: Medicaid - Out of State

## 2021-04-12 DIAGNOSIS — M25511 Pain in right shoulder: Secondary | ICD-10-CM | POA: Diagnosis not present

## 2021-04-12 DIAGNOSIS — M792 Neuralgia and neuritis, unspecified: Secondary | ICD-10-CM

## 2021-04-12 DIAGNOSIS — M79641 Pain in right hand: Secondary | ICD-10-CM | POA: Diagnosis present

## 2021-04-12 DIAGNOSIS — F17210 Nicotine dependence, cigarettes, uncomplicated: Secondary | ICD-10-CM | POA: Diagnosis not present

## 2021-04-12 DIAGNOSIS — R200 Anesthesia of skin: Secondary | ICD-10-CM | POA: Diagnosis not present

## 2021-04-12 DIAGNOSIS — M79601 Pain in right arm: Secondary | ICD-10-CM | POA: Insufficient documentation

## 2021-04-12 MED ORDER — METHOCARBAMOL 500 MG PO TABS: 500.0000 mg | ORAL_TABLET | Freq: Two times a day (BID) | ORAL | 0 refills | Status: AC

## 2021-04-12 MED ORDER — DICLOFENAC POTASSIUM 50 MG PO TABS: 50.0000 mg | ORAL_TABLET | Freq: Two times a day (BID) | ORAL | 0 refills | Status: AC

## 2021-04-12 MED ORDER — DICLOFENAC SODIUM 50 MG PO TBEC: 50.0000 mg | DELAYED_RELEASE_TABLET | Freq: Two times a day (BID) | ORAL | 0 refills | Status: AC

## 2021-04-12 NOTE — ED Triage Notes (Signed)
Pt here from home with c/o right wrist pain , pt works at [employer]  and using that wrist a lot , no trauma noted

## 2021-04-12 NOTE — ED Provider Notes (Signed)
[facility] EMERGENCY DEPARTMENT Provider Note   CSN: [id number] Arrival date & time: 04/12/21  0754     History No chief complaint on file.   Alexis Ferrell is a 35 y.o. female.  35 year old female presents with complaint of pain with numbness and tingling in her whole right hand which radiates up her arm to her shoulder.  Patient states this is per been progressively worsening, states that she works in [employer] and is right-hand dominant, pain is limiting her job.  Patient has tried taking ibuprofen without improvement.  Patient states that her mother had carpal tunnel and feels this is same.  She denies any falls or injuries or other trauma to the arm.      Past Medical History:  Diagnosis Date   Anxiety     There are no problems to display for this patient.   Past Surgical History:  Procedure Laterality Date   CESAREAN SECTION  2016   TUBAL LIGATION  2016     OB History   No obstetric history on file.     No family history on file.  Social History   Tobacco Use   Smoking status: Every Day    Packs/day: 0.50    Types: Cigarettes   Smokeless tobacco: Current  Substance Use Topics   Alcohol use: No   Drug use: No    Home Medications Prior to Admission medications   Medication Sig Start Date End Date Taking? Authorizing Provider  diclofenac (CATAFLAM) 50 MG tablet Take 1 tablet (50 mg total) by mouth 2 (two) times daily for 10 days. 04/12/21 04/22/21 Yes Jeannie Fend, PA-C  diclofenac (VOLTAREN) 50 MG EC tablet Take 1 tablet (50 mg total) by mouth 2 (two) times daily for 10 days. 04/12/21 04/22/21 Yes Jeannie Fend, PA-C  methocarbamol (ROBAXIN) 500 MG tablet Take 1 tablet (500 mg total) by mouth 2 (two) times daily. 04/12/21  Yes Jeannie Fend, PA-C  methocarbamol (ROBAXIN) 500 MG tablet Take 1 tablet (500 mg total) by mouth 2 (two) times daily. 04/12/21  Yes Jeannie Fend, PA-C  sulfamethoxazole-trimethoprim (BACTRIM DS) 800-160 MG  tablet Take 1 tablet by mouth 2 (two) times daily. 08/02/20   Pabon, Merri Ray, MD    Allergies    Banana, Watermelon flavor, and Penicillins  Review of Systems   Review of Systems  Constitutional:  Negative for fever.  Musculoskeletal:  Positive for myalgias. Negative for arthralgias.  Skin:  Negative for color change, rash and wound.  Allergic/Immunologic: Negative for immunocompromised state.  Neurological:  Positive for numbness. Negative for weakness.  Hematological:  Negative for adenopathy.  Psychiatric/Behavioral:  Negative for confusion.   All other systems reviewed and are negative.  Physical Exam Updated Vital Signs BP 137/90   Pulse (!) 105   Temp 98.3 F (36.8 C) (Oral)   Resp 14   LMP 03/22/2021 (Approximate)   SpO2 97%   Physical Exam Vitals and nursing note reviewed.  Constitutional:      General: She is not in acute distress.    Appearance: She is well-developed. She is not diaphoretic.  HENT:     Head: Normocephalic and atraumatic.  Cardiovascular:     Pulses: Normal pulses.  Pulmonary:     Effort: Pulmonary effort is normal.  Musculoskeletal:        General: Tenderness present. No swelling, deformity or signs of injury.     Comments: TTP right trapezius area. Normal ROM RUE, sensation intact  Skin:    General: Skin is warm and dry.     Capillary Refill: Capillary refill takes less than 2 seconds.     Findings: No bruising, erythema or rash.  Neurological:     Mental Status: She is alert and oriented to person, place, and time.     Sensory: No sensory deficit.     Motor: No weakness.  Psychiatric:        Behavior: Behavior normal.    ED Results / Procedures / Treatments   Labs (all labs ordered are listed, but only abnormal results are displayed) Labs Reviewed - No data to display  EKG None  Radiology DG Wrist Complete Right  Result Date: 04/12/2021 CLINICAL DATA:  Right wrist pain and numbness for 3 days EXAM: RIGHT WRIST - COMPLETE 3+  VIEW COMPARISON:  None. FINDINGS: There is no acute fracture or dislocation. Radiocarpal alignment is normal. The joint spaces are preserved. The soft tissues are unremarkable. IMPRESSION: Normal wrist radiographs Electronically Signed   By: Lesia Hausen M.D.   On: 04/12/2021 08:31    Procedures Procedures   Medications Ordered in ED Medications - No data to display  ED Course  I have reviewed the triage vital signs and the nursing notes.  Pertinent labs & imaging results that were available during my care of the patient were reviewed by me and considered in my medical decision making (see chart for details).  Clinical Course as of 04/12/21 0857  Thu Apr 12, 2021  2719 35 year old female with complaint of pain and numbness in her right arm as above.  Grip strength is equal, sensation intact, brisk capillary refill present to each finger.  She does have palpable spasm and tenderness in her right trapezius area.  X-ray of the right wrist is negative for acute injury.  Discussed with patient various causes of the radicular pain she is experiencing, generally treated with gentle stretching, anti-inflammatory and muscle relaxant.  Referred to orthopedics for follow-up although advised to contact her employer, if this is a work-related problem she may benefit from [employer]. evaluation. [LM]    Clinical Course User Index [LM] Alden Hipp   MDM Rules/Calculators/A&P                            Final Clinical Impression(s) / ED Diagnoses Final diagnoses:  Radicular pain in right arm    Rx / DC Orders ED Discharge Orders          Ordered    methocarbamol (ROBAXIN) 500 MG tablet  2 times daily        04/12/21 0844    diclofenac (VOLTAREN) 50 MG EC tablet  2 times daily        04/12/21 0844    methocarbamol (ROBAXIN) 500 MG tablet  2 times daily        04/12/21 0855    diclofenac (CATAFLAM) 50 MG tablet  2 times daily        04/12/21 0855             Jeannie Fend, PA-C 04/12/21 0539    Terald Sleeper, MD 04/12/21 1118

## 2021-04-12 NOTE — Discharge Instructions (Addendum)
Discuss your problem with your employer who may have you see their workers comp provider to evaluate if this is caused by your work duties.  Referral to orthopedics for further treatment and evaluation if your job will not send you to a workers comp provider.   Take diclofenac and Robaxin as prescribed.  Gentle stretching as discussed.

## 2021-06-20 ENCOUNTER — Encounter (HOSPITAL_COMMUNITY): Payer: Self-pay

## 2021-06-20 ENCOUNTER — Other Ambulatory Visit: Payer: Self-pay

## 2021-06-20 ENCOUNTER — Emergency Department (HOSPITAL_COMMUNITY)
Admission: EM | Admit: 2021-06-20 | Discharge: 2021-06-20 | Disposition: A | Discharge status: Home / Self Care | Payer: PRIVATE HEALTH INSURANCE | Attending: Emergency Medicine | Admitting: Emergency Medicine

## 2021-06-20 DIAGNOSIS — L02415 Cutaneous abscess of right lower limb: Secondary | ICD-10-CM | POA: Insufficient documentation

## 2021-06-20 DIAGNOSIS — F17210 Nicotine dependence, cigarettes, uncomplicated: Secondary | ICD-10-CM | POA: Insufficient documentation

## 2021-06-20 DIAGNOSIS — L0291 Cutaneous abscess, unspecified: Secondary | ICD-10-CM

## 2021-06-20 MED ORDER — IBUPROFEN 800 MG PO TABS
800.0000 mg | ORAL_TABLET | Freq: Once | ORAL | Status: AC
  Administered 2021-06-20: 800 mg via ORAL
  Filled 2021-06-20: qty 1

## 2021-06-20 MED ORDER — DOXYCYCLINE HYCLATE 100 MG PO CAPS: 100.0000 mg | ORAL_CAPSULE | Freq: Two times a day (BID) | ORAL | 0 refills | Status: AC

## 2021-06-20 MED ORDER — DOXYCYCLINE HYCLATE 100 MG PO TABS
100.0000 mg | ORAL_TABLET | Freq: Once | ORAL | Status: AC
  Administered 2021-06-20: 100 mg via ORAL
  Filled 2021-06-20: qty 1

## 2021-06-20 MED ORDER — LIDOCAINE-EPINEPHRINE (PF) 2 %-1:200000 IJ SOLN
10.0000 mL | Freq: Once | INTRAMUSCULAR | Status: AC
  Administered 2021-06-20: 10 mL
  Filled 2021-06-20: qty 20

## 2021-06-20 MED ORDER — DOXYCYCLINE HYCLATE 100 MG PO CAPS
100.0000 mg | ORAL_CAPSULE | Freq: Two times a day (BID) | ORAL | 0 refills | Status: DC
Start: 2021-06-20 — End: 2021-06-20

## 2021-06-20 NOTE — ED Triage Notes (Addendum)
Patient has burst abscess to right thigh. Patient states she had surgery to remove cyst to same area 6 months ago. Patient has since had the abcess return multiple times. Patient is tearful at this time.   Patient also reports multiple courses of antibiotic for same issue

## 2021-06-20 NOTE — ED Notes (Signed)
Murphy PA at bedside. 

## 2021-06-20 NOTE — ED Provider Notes (Signed)
[facility] EMERGENCY DEPARTMENT Provider Note   CSN: [id number] Arrival date & time: 06/20/21  3790     History Chief Complaint  Patient presents with   Abscess    Alexis Ferrell is a 35 y.o. female.  35 year old female with complaint of right thigh abscess for the past few days.  Reports recurrent problem, has had surgery to same area however continues to have abscesses here and is very frustrated.  States that she went home from work yesterday, has been applying warm compresses and the area has since opened and drained.  Here today requesting antibiotics as well as further drainage of area.      Past Medical History:  Diagnosis Date   Anxiety     There are no problems to display for this patient.   Past Surgical History:  Procedure Laterality Date   CESAREAN SECTION  2016   TUBAL LIGATION  2016     OB History   No obstetric history on file.     History reviewed. No pertinent family history.  Social History   Tobacco Use   Smoking status: Every Day    Packs/day: 0.50    Types: Cigarettes   Smokeless tobacco: Current  Substance Use Topics   Alcohol use: No   Drug use: No    Home Medications Prior to Admission medications   Medication Sig Start Date End Date Taking? Authorizing Provider  doxycycline (VIBRAMYCIN) 100 MG capsule Take 1 capsule (100 mg total) by mouth 2 (two) times daily. 06/20/21  Yes Jeannie Fend, PA-C  methocarbamol (ROBAXIN) 500 MG tablet Take 1 tablet (500 mg total) by mouth 2 (two) times daily. 04/12/21   Jeannie Fend, PA-C  methocarbamol (ROBAXIN) 500 MG tablet Take 1 tablet (500 mg total) by mouth 2 (two) times daily. 04/12/21   Jeannie Fend, PA-C  sulfamethoxazole-trimethoprim (BACTRIM DS) 800-160 MG tablet Take 1 tablet by mouth 2 (two) times daily. 08/02/20   Pabon, Merri Ray, MD    Allergies    Banana, Watermelon flavor, and Penicillins  Review of Systems   Review of Systems  Constitutional:  Negative  for fever.  Musculoskeletal:  Positive for myalgias. Negative for arthralgias.  Skin:  Positive for wound.  Allergic/Immunologic: Negative for immunocompromised state.  Neurological:  Negative for weakness and numbness.  Hematological:  Negative for adenopathy.  Psychiatric/Behavioral:  Negative for confusion.    Physical Exam Updated Vital Signs BP (!) 123/93   Pulse 93   Temp 98 F (36.7 C)   Resp 16   SpO2 99%   Physical Exam Vitals and nursing note reviewed.  Constitutional:      General: She is not in acute distress.    Appearance: She is well-developed. She is not diaphoretic.  HENT:     Head: Normocephalic and atraumatic.  Cardiovascular:     Pulses: Normal pulses.  Pulmonary:     Effort: Pulmonary effort is normal.  Abdominal:     Palpations: Abdomen is soft.     Tenderness: There is no abdominal tenderness.  Musculoskeletal:        General: Tenderness present.  Skin:    General: Skin is warm and dry.     Findings: Erythema present.     Comments: Right inner proximal thigh with approximately 2cm x 3cm area of induration with central drainage (purulent material)  Neurological:     Mental Status: She is alert and oriented to person, place, and time.  Sensory: No sensory deficit.     Motor: No weakness.  Psychiatric:        Behavior: Behavior normal.    ED Results / Procedures / Treatments   Labs (all labs ordered are listed, but only abnormal results are displayed) Labs Reviewed - No data to display  EKG None  Radiology No results found.  Procedures Procedures   Medications Ordered in ED Medications  ibuprofen (ADVIL) tablet 800 mg (800 mg Oral Given 06/20/21 0736)  doxycycline (VIBRA-TABS) tablet 100 mg (100 mg Oral Given 06/20/21 0736)  lidocaine-EPINEPHrine (XYLOCAINE W/EPI) 2 %-1:200000 (PF) injection 10 mL (10 mLs Infiltration Given 06/20/21 0809)    ED Course  I have reviewed the triage vital signs and the nursing notes.  Pertinent labs  & imaging results that were available during my care of the patient were reviewed by me and considered in my medical decision making (see chart for details).  Clinical Course as of 06/20/21 8242  Wed Jun 20, 2021  7150 35 year old female presents with recurrent abscess to her right inner thigh.  Area began draining this morning.  On exam, has purulent drainage to the right inner thigh with surrounding induration. Patient had requested area to be anesthetized so that further drainage could be expressed however when attempted to do this, she declined. Plan is to discharge on doxycycline, encouraged continuation of warm compresses and follow-up with surgery as she would like this area evaluated as she has had recurrent abscesses. [LM]    Clinical Course User Index [LM] Alden Hipp   MDM Rules/Calculators/A&P                           Final Clinical Impression(s) / ED Diagnoses Final diagnoses:  Abscess    Rx / DC Orders ED Discharge Orders          Ordered    doxycycline (VIBRAMYCIN) 100 MG capsule  2 times daily        06/20/21 0818             Jeannie Fend, PA-C 06/20/21 3536    Benjiman Core, MD 06/20/21 1920

## 2021-06-20 NOTE — Discharge Instructions (Addendum)
Follow-up with surgery for further treatment of the abscess in your leg.  If you are unable to afford follow-up with surgery, contact [facility] or [place] [facility] to establish primary care for further assistance.  Continue with warm compresses to area for 20 minutes at a time.  Take antibiotics as prescribed and complete the full course.

## 2021-06-20 NOTE — ED Notes (Signed)
Discharge instructions reviewed with patient. Patient verbalized understanding of instructions. Follow-up care and medications were reviewed. Patient ambulatory with steady gait. VSS upon discharge.  ?

## 2022-09-18 IMAGING — DX DG WRIST COMPLETE 3+V*R*
4 series · 4 of 4 positions shown · non-contrast
Comparison: None.

CLINICAL DATA: Right wrist pain and numbness for 3 days

EXAM:
RIGHT WRIST - COMPLETE 3+ VIEW

[wrist pa]
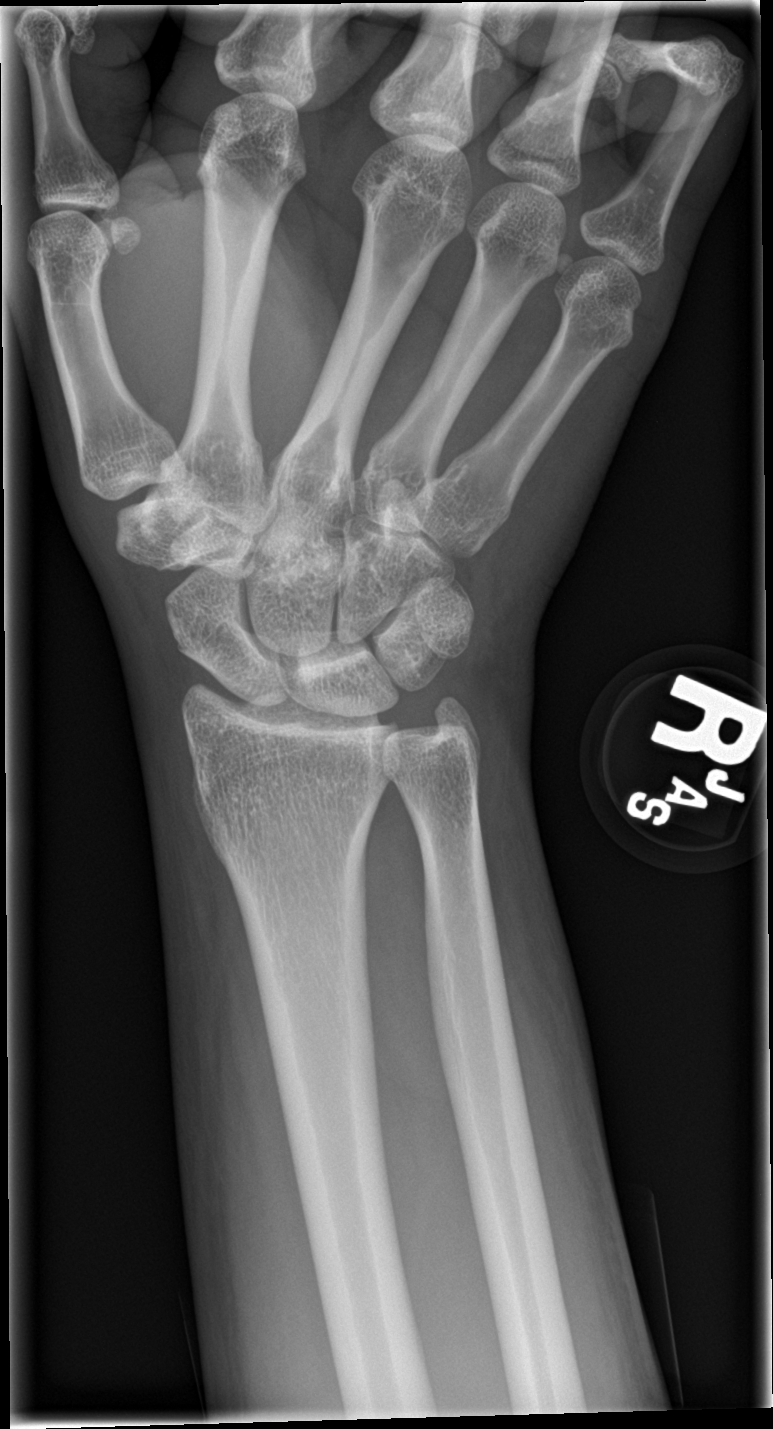

[wrist obl]
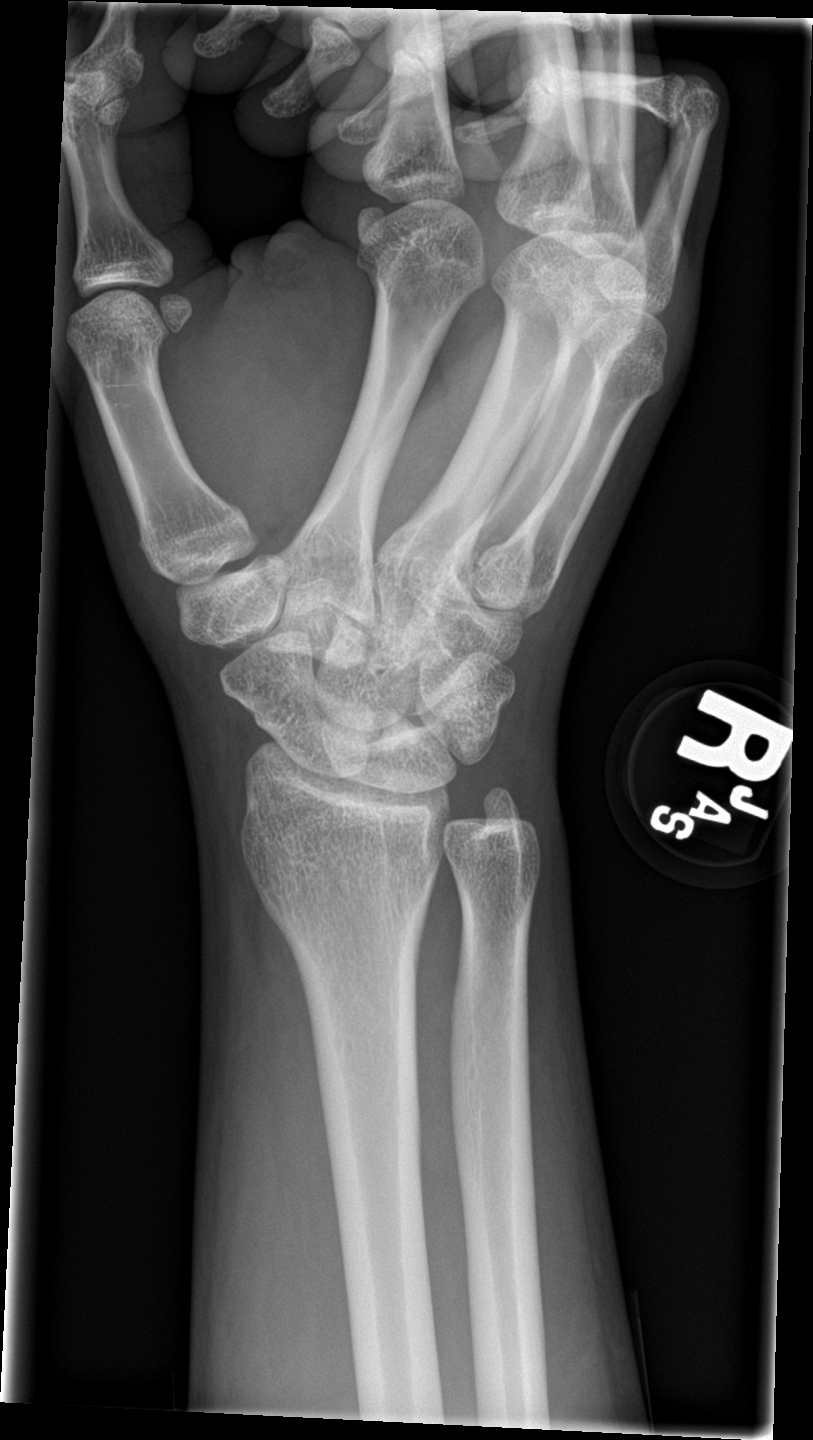

[wrist lat]
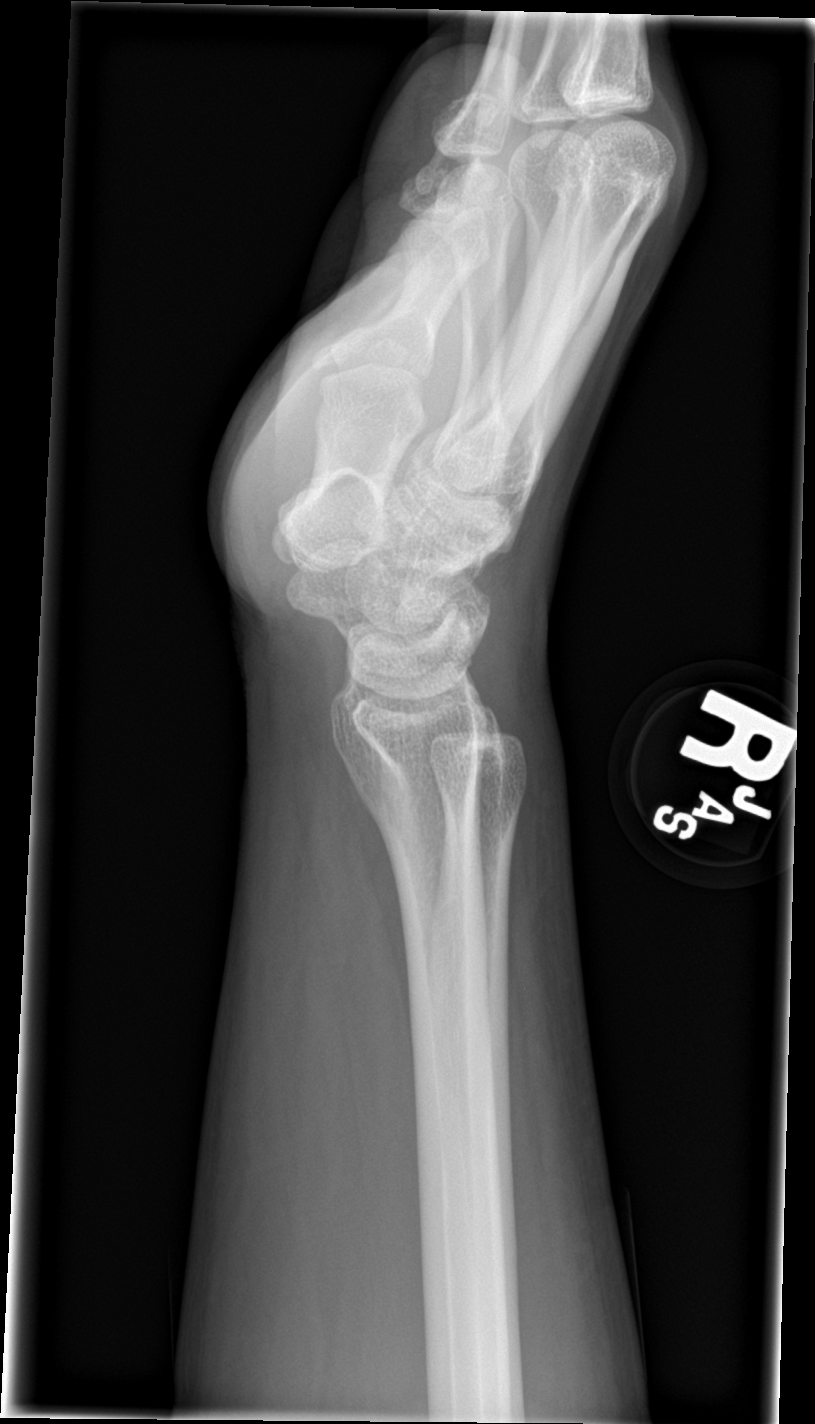

[wrist navicular]
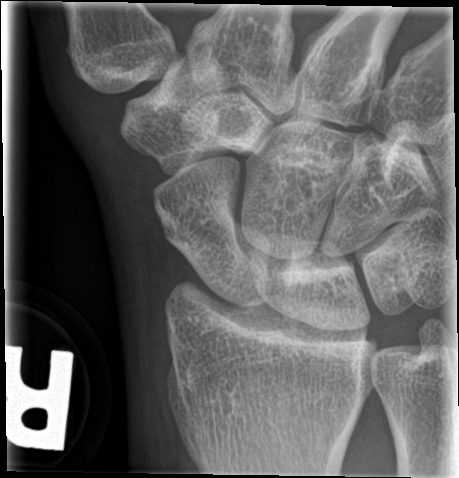

[4 of 4 positions shown; findings below may reference images not displayed]

FINDINGS: There is no acute fracture or dislocation. Radiocarpal alignment is
normal. The joint spaces are preserved. The soft tissues are
unremarkable.
IMPRESSION: Normal wrist radiographs
